# Patient Record
Sex: Male | Born: 2000 | Race: White | Hispanic: No | Marital: Single | Smoking: Never smoker
Health system: Southern US, Community
[De-identification: ages and names within clinical notes are randomized; demographics above are authoritative.]

## PROBLEM LIST (undated history)

## (undated) DIAGNOSIS — J45909 Unspecified asthma, uncomplicated: Secondary | ICD-10-CM

## (undated) DIAGNOSIS — R42 Dizziness and giddiness: Secondary | ICD-10-CM

## (undated) DIAGNOSIS — I951 Orthostatic hypotension: Secondary | ICD-10-CM

## (undated) DIAGNOSIS — S62609A Fracture of unspecified phalanx of unspecified finger, initial encounter for closed fracture: Secondary | ICD-10-CM

## (undated) DIAGNOSIS — R000 Tachycardia, unspecified: Secondary | ICD-10-CM

## (undated) DIAGNOSIS — S060X9A Concussion with loss of consciousness of unspecified duration, initial encounter: Secondary | ICD-10-CM

## (undated) DIAGNOSIS — H5210 Myopia, unspecified eye: Secondary | ICD-10-CM

## (undated) HISTORY — DX: Tachycardia, unspecified: R00.0

## (undated) HISTORY — DX: Unspecified asthma, uncomplicated: J45.909

## (undated) HISTORY — DX: Fracture of unspecified phalanx of unspecified finger, initial encounter for closed fracture: S62.609A

## (undated) HISTORY — DX: Myopia, unspecified eye: H52.10

## (undated) HISTORY — DX: Orthostatic hypotension: I95.1

## (undated) HISTORY — DX: Concussion with loss of consciousness of unspecified duration, initial encounter: S06.0X9A

---
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
## 2020-01-14 ENCOUNTER — Other Ambulatory Visit: Payer: BC Managed Care – PPO

## 2020-01-20 ENCOUNTER — Other Ambulatory Visit: Payer: BC Managed Care – PPO

## 2020-10-22 DIAGNOSIS — F17290 Nicotine dependence, other tobacco product, uncomplicated: Secondary | ICD-10-CM | POA: Diagnosis not present

## 2020-10-22 DIAGNOSIS — S0101XA Laceration without foreign body of scalp, initial encounter: Secondary | ICD-10-CM | POA: Diagnosis not present

## 2020-10-22 DIAGNOSIS — Z23 Encounter for immunization: Secondary | ICD-10-CM | POA: Diagnosis not present

## 2020-10-22 DIAGNOSIS — W2209XA Striking against other stationary object, initial encounter: Secondary | ICD-10-CM | POA: Diagnosis not present

## 2020-10-30 DIAGNOSIS — S0101XD Laceration without foreign body of scalp, subsequent encounter: Secondary | ICD-10-CM | POA: Diagnosis not present

## 2021-02-23 DIAGNOSIS — R030 Elevated blood-pressure reading, without diagnosis of hypertension: Secondary | ICD-10-CM | POA: Diagnosis not present

## 2021-02-23 DIAGNOSIS — Z8709 Personal history of other diseases of the respiratory system: Secondary | ICD-10-CM | POA: Diagnosis not present

## 2021-02-23 DIAGNOSIS — Z1331 Encounter for screening for depression: Secondary | ICD-10-CM | POA: Diagnosis not present

## 2021-02-23 DIAGNOSIS — Z0000 Encounter for general adult medical examination without abnormal findings: Secondary | ICD-10-CM | POA: Diagnosis not present

## 2021-03-01 DIAGNOSIS — Z8709 Personal history of other diseases of the respiratory system: Secondary | ICD-10-CM | POA: Diagnosis not present

## 2021-03-29 ENCOUNTER — Other Ambulatory Visit (HOSPITAL_COMMUNITY): Payer: Self-pay

## 2021-11-24 DIAGNOSIS — R509 Fever, unspecified: Secondary | ICD-10-CM | POA: Diagnosis not present

## 2021-11-24 DIAGNOSIS — Z6822 Body mass index (BMI) 22.0-22.9, adult: Secondary | ICD-10-CM | POA: Diagnosis not present

## 2021-11-24 DIAGNOSIS — J029 Acute pharyngitis, unspecified: Secondary | ICD-10-CM | POA: Diagnosis not present
